# Patient Record
Sex: Female | Born: 1960 | Race: White | Hispanic: No | Marital: Married | Smoking: Never smoker
Health system: Southern US, Community
[De-identification: ages and names within clinical notes are randomized; demographics above are authoritative.]

## PROBLEM LIST (undated history)

## (undated) DIAGNOSIS — F419 Anxiety disorder, unspecified: Secondary | ICD-10-CM

## (undated) DIAGNOSIS — K219 Gastro-esophageal reflux disease without esophagitis: Secondary | ICD-10-CM

## (undated) DIAGNOSIS — D126 Benign neoplasm of colon, unspecified: Secondary | ICD-10-CM

## (undated) DIAGNOSIS — F32A Depression, unspecified: Secondary | ICD-10-CM

## (undated) DIAGNOSIS — I10 Essential (primary) hypertension: Secondary | ICD-10-CM

## (undated) DIAGNOSIS — E785 Hyperlipidemia, unspecified: Secondary | ICD-10-CM

## (undated) HISTORY — DX: Anxiety disorder, unspecified: F41.9

## (undated) HISTORY — DX: Essential (primary) hypertension: I10

## (undated) HISTORY — DX: Hyperlipidemia, unspecified: E78.5

## (undated) HISTORY — PX: COLONOSCOPY: SHX174

## (undated) HISTORY — DX: Gastro-esophageal reflux disease without esophagitis: K21.9

## (undated) HISTORY — DX: Depression, unspecified: F32.A

## (undated) HISTORY — DX: Benign neoplasm of colon, unspecified: D12.6

---

## 2021-05-12 ENCOUNTER — Encounter (HOSPITAL_COMMUNITY): Payer: Self-pay

## 2021-05-12 ENCOUNTER — Emergency Department (HOSPITAL_COMMUNITY)
Admission: EM | Admit: 2021-05-12 | Discharge: 2021-05-12 | Disposition: A | Discharge status: Home / Self Care | Payer: BC Managed Care – PPO | Attending: Emergency Medicine | Admitting: Emergency Medicine

## 2021-05-12 ENCOUNTER — Emergency Department (HOSPITAL_COMMUNITY): Payer: BC Managed Care – PPO

## 2021-05-12 ENCOUNTER — Other Ambulatory Visit: Payer: Self-pay

## 2021-05-12 DIAGNOSIS — R110 Nausea: Secondary | ICD-10-CM | POA: Insufficient documentation

## 2021-05-12 DIAGNOSIS — Z7982 Long term (current) use of aspirin: Secondary | ICD-10-CM | POA: Diagnosis not present

## 2021-05-12 DIAGNOSIS — R002 Palpitations: Secondary | ICD-10-CM | POA: Diagnosis not present

## 2021-05-12 DIAGNOSIS — R079 Chest pain, unspecified: Secondary | ICD-10-CM

## 2021-05-12 DIAGNOSIS — R0789 Other chest pain: Secondary | ICD-10-CM | POA: Diagnosis present

## 2021-05-12 LAB — CBC
HCT: 43.8 % (ref 36.0–46.0)
Hemoglobin: 14.5 g/dL (ref 12.0–15.0)
MCH: 31.2 pg (ref 26.0–34.0)
MCHC: 33.1 g/dL (ref 30.0–36.0)
MCV: 94.2 fL (ref 80.0–100.0)
Platelets: 233 10*3/uL (ref 150–400)
RBC: 4.65 MIL/uL (ref 3.87–5.11)
RDW: 13.5 % (ref 11.5–15.5)
WBC: 10.7 10*3/uL — ABNORMAL HIGH (ref 4.0–10.5)
nRBC: 0 % (ref 0.0–0.2)

## 2021-05-12 LAB — BASIC METABOLIC PANEL
Anion gap: 13 (ref 5–15)
BUN: 14 mg/dL (ref 8–23)
CO2: 24 mmol/L (ref 22–32)
Calcium: 9.2 mg/dL (ref 8.9–10.3)
Chloride: 99 mmol/L (ref 98–111)
Creatinine, Ser: 0.87 mg/dL (ref 0.44–1.00)
GFR, Estimated: >60 mL/min (ref >60)
Glucose, Bld: 103 mg/dL — ABNORMAL HIGH (ref 70–99)
Potassium: 3.3 mmol/L — ABNORMAL LOW (ref 3.5–5.1)
Sodium: 136 mmol/L (ref 135–145)

## 2021-05-12 LAB — TROPONIN I (HIGH SENSITIVITY)
Troponin I (High Sensitivity): <2 ng/L (ref <18)
Troponin I (High Sensitivity): <2 ng/L (ref <18)

## 2021-05-12 NOTE — ED Provider Notes (Signed)
[facility] EMERGENCY DEPARTMENT Provider Note   CSN: [id number] Arrival date & time: 05/12/21  1258     History Chief Complaint  Patient presents with   Chest Pain    Natalie Fox is a 60 y.o. female.   Chest Pain Associated symptoms: nausea and palpitations   Associated symptoms: no abdominal pain, no back pain, no shortness of breath and no weakness   Patient presents with chest pain.  Began around 1130 today.  Lasted around 20 to 25 minutes.  It was severe in the anterior chest.  May have said some slight radiation to the left side.  No nausea or vomiting.  No diaphoresis.  No fevers or chills.  No radiation to arm.  No shortness of breath.  Did not feel palpitations with it.  Patient states she had a brief episode of similar pain on Saturday all that lasted just under a minute.  States on Saturday night she felt some palpitations.  Patient does not have a known cardiac history but states her family members have early cardiac disease with MIs in the 1s.  Has had no exertional pain.  Able to exert herself without any pain.  Has had a stress test around 3 years ago.  Patient is pain-free now.      History reviewed. No pertinent past medical history.  There are no problems to display for this patient.   History reviewed. No pertinent surgical history.   OB History   No obstetric history on file.     No family history on file.  Social History   Tobacco Use   Smoking status: Never   Smokeless tobacco: Never  Substance Use Topics   Alcohol use: Not Currently   Drug use: Never    Home Medications Prior to Admission medications   Medication Sig Start Date End Date Taking? Authorizing Provider  ALPRAZolam Duanne Moron) 0.5 MG tablet Take 0.5 mg by mouth 2 (two) times daily. 04/21/21  Yes [provider]  aspirin 325 MG EC tablet Take 325 mg by mouth daily.   Yes [provider]  Calcium 200 MG TABS Take 1 tablet by mouth daily.   Yes [provider]  Cholecalciferol (VITAMIN D3) 50 MCG (2000 UT) CAPS Take 1 capsule by mouth daily.   Yes [provider]  FLUoxetine (PROZAC) 20 MG capsule Take 20 mg by mouth See admin instructions. Take 40 mg alternate 20mg  every other day. 05/02/21  Yes [provider]  triamterene-hydrochlorothiazide (DYAZIDE) 37.5-25 MG capsule Take 1 capsule by mouth daily. 04/21/21  Yes [provider]  doxycycline (MONODOX) 100 MG capsule SMARTSIG:2 Capsule(s) By Mouth Once Patient not taking: No sig reported 04/23/21   [provider]  predniSONE (DELTASONE) 20 MG tablet Take by mouth. Patient not taking: No sig reported 04/21/21   [provider]    Allergies    Patient has no known allergies.  Review of Systems   Review of Systems  Constitutional:  Negative for appetite change.  HENT:  Negative for congestion.   Respiratory:  Negative for shortness of breath.   Cardiovascular:  Positive for chest pain and palpitations.  Gastrointestinal:  Positive for nausea. Negative for abdominal pain.  Genitourinary:  Negative for flank pain.  Musculoskeletal:  Negative for back pain.  Skin:  Negative for pallor.  Neurological:  Negative for weakness.  Psychiatric/Behavioral:  Negative for confusion.    Physical Exam Updated Vital Signs BP 127/70 (BP Location: Right Arm)   Pulse  66   Temp 98.2 F (36.8 C) (Oral)   Resp 18   Ht 5\' 3"  (1.6 m)   Wt 75.8 kg   SpO2 99%   BMI 29.60 kg/m   Physical Exam Vitals and nursing note reviewed.  HENT:     Head: Atraumatic.  Cardiovascular:     Rate and Rhythm: Normal rate and regular rhythm.  Pulmonary:     Effort: Pulmonary effort is normal.     Breath sounds: No wheezing or rales.  Musculoskeletal:     Right lower leg: No edema.     Left lower leg: No edema.  Skin:    General: Skin is warm.     Capillary Refill: Capillary refill takes less than 2 seconds.  Neurological:     Mental Status: She is alert and  oriented to person, place, and time.    ED Results / Procedures / Treatments   Labs (all labs ordered are listed, but only abnormal results are displayed) Labs Reviewed  BASIC METABOLIC PANEL - Abnormal; Notable for the following components:      Result Value   Potassium 3.3 (*)    Glucose, Bld 103 (*)    All other components within normal limits  CBC - Abnormal; Notable for the following components:   WBC 10.7 (*)    All other components within normal limits  TROPONIN I (HIGH SENSITIVITY)  TROPONIN I (HIGH SENSITIVITY)    EKG EKG Interpretation  Date/Time:  Tuesday May 12 2021 13:26:53 EDT Ventricular Rate:  72 PR Interval:  144 QRS Duration: 84 QT Interval:  402 QTC Calculation: 440 R Axis:   78 Text Interpretation: Normal sinus rhythm Normal ECG No old tracing to compare Confirmed by Daleen Bo [phone]) on 05/12/2021 2:02:13 PM  Radiology DG Chest 2 View  Result Date: 05/12/2021 CLINICAL DATA:  Chest pain. EXAM: CHEST - 2 VIEW COMPARISON:  None. FINDINGS: The cardiac silhouette, mediastinal and hilar contours are within normal limits. The lungs are clear of an acute process. Mild chronic appearing bronchitic type changes. No pleural effusions or pulmonary lesions. The bony thorax is intact. IMPRESSION: No acute cardiopulmonary findings. Electronically Signed   By: Marijo Sanes M.D.   On: 05/12/2021 14:28    Procedures Procedures   Medications Ordered in ED Medications - No data to display  ED Course  I have reviewed the triage vital signs and the nursing notes.  Pertinent labs & imaging results that were available during my care of the patient were reviewed by me and considered in my medical decision making (see chart for details).    MDM Rules/Calculators/A&P                           Patient with anterior chest pain.  Last around 20 minutes.  Pain-free now.  EKG reassuring.  Troponin negative x2.  Chest x-ray reassuring.  Doubt cardiac ischemia as a  cause.  Doubt aortic dissection.  Doubt pulmonary embolism.  Well-appearing.  Lab work and EKG reviewed.  Chest x-ray reviewed.  Follow-up with PCP and also has planned follow-up with cardiology.  Discharge home Final Clinical Impression(s) / ED Diagnoses Final diagnoses:  Nonspecific chest pain    Rx / DC Orders ED Discharge Orders     None        Davonna Belling, MD 05/12/21 1656

## 2021-05-12 NOTE — ED Triage Notes (Signed)
Pt presents to ED with complaints of sharp mid chest pain started at 1130 subsided after about 20 minutes. Pt states radiated to left some, denies diaphoresis or nausea.

## 2021-06-15 ENCOUNTER — Encounter: Payer: Self-pay | Admitting: *Deleted

## 2021-06-16 ENCOUNTER — Other Ambulatory Visit: Payer: Self-pay

## 2021-06-16 ENCOUNTER — Ambulatory Visit: Payer: BC Managed Care – PPO | Admitting: Cardiology

## 2021-06-16 ENCOUNTER — Encounter: Payer: Self-pay | Admitting: Cardiology

## 2021-06-16 VITALS — BP 130/86 | HR 70 | Ht 63.0 in | Wt 168.0 lb

## 2021-06-16 DIAGNOSIS — E782 Mixed hyperlipidemia: Secondary | ICD-10-CM | POA: Diagnosis not present

## 2021-06-16 DIAGNOSIS — I209 Angina pectoris, unspecified: Secondary | ICD-10-CM

## 2021-06-16 DIAGNOSIS — Z8249 Family history of ischemic heart disease and other diseases of the circulatory system: Secondary | ICD-10-CM

## 2021-06-16 DIAGNOSIS — I10 Essential (primary) hypertension: Secondary | ICD-10-CM | POA: Diagnosis not present

## 2021-06-16 MED ORDER — METOPROLOL TARTRATE 100 MG PO TABS: 100.0000 mg | ORAL_TABLET | Freq: Once | ORAL | 0 refills | Status: AC

## 2021-06-16 NOTE — Patient Instructions (Addendum)
Medication Instructions:  Your physician recommends that you continue on your current medications as directed. Please refer to the Current Medication list given to you today.  Labwork: none  Testing/Procedures: Coronary CTA-see instructions below  Follow-Up: Your physician recommends that you schedule a follow-up appointment in: pending  Any Other Special Instructions Will Be Listed Below (If Applicable).  If you need a refill on your cardiac medications before your next appointment, please call your pharmacy.    Your cardiac CT will be scheduled at one of the below locations:   [facility] [address] [place], [place] [address] [phone]  If scheduled at [facility], please arrive at the [facility] main entrance (entrance A) of [facility] 30 minutes prior to test start time. You can use the FREE valet parking offered at the main entrance (encouraged to control the heart rate for the test) Proceed to the [facility] Radiology Department (first floor) to check-in and test prep.  Please follow these instructions carefully (unless otherwise directed):  On the Night Before the Test: Be sure to Drink plenty of water. Do not consume any caffeinated/decaffeinated beverages or chocolate 12 hours prior to your test. Do not take any antihistamines 12 hours prior to your test.  On the Day of the Test: Drink plenty of water until 1 hour prior to the test. Do not eat any food 4 hours prior to the test. You may take your regular medications prior to the test.  Take metoprolol 100 mg (Lopressor) one - two hours prior to test. HOLD triamterene/hctz the morning of the test. FEMALES- please wear underwire-free bra if available, avoid dresses & tight clothing     After the Test: Drink plenty of water. After receiving IV contrast, you may experience a mild flushed feeling. This is normal. On occasion, you may experience a mild rash up to 24 hours after  the test. This is not dangerous. If this occurs, you can take Benadryl 25 mg and increase your fluid intake. If you experience trouble breathing, this can be serious. If it is severe call 911 IMMEDIATELY. If it is mild, please call our office.  Please allow 2-4 weeks for scheduling of routine cardiac CTs. Some insurance companies require a pre-authorization which may delay scheduling of this test.   For non-scheduling related questions, please contact the cardiac imaging nurse navigator should you have any questions/concerns: Marchia Bond, Cardiac Imaging Nurse Navigator Gordy Clement, Cardiac Imaging Nurse Navigator [facility] Direct Office Dial: [phone]   For scheduling needs, including cancellations and rescheduling, please call [place], [phone].

## 2021-06-16 NOTE — Progress Notes (Signed)
Cardiology Office Note  Date: 06/16/2021   ID: Natalie, Fox 12-Sep-1960, MRN [id number]  PCP:  Manon Hilding, MD  Cardiologist:  Rozann Lesches, MD Electrophysiologist:  None   Chief Complaint  Patient presents with   Chest Pain     History of Present Illness: Natalie Fox is a 60 y.o. female referred for for cardiology consultation by Dr. Quintin Alto for the evaluation of chest pain.  She has a history of hypertension on medical therapy, recent LDL 155, also family history of premature CAD in a younger brother and also her maternal grandparents.  She has been concerned about the status of her heart, but has no known history of atherosclerosis.  She did undergo a screening GXT 5 years ago which was normal.  She states that she had an episode of "deep" intense chest discomfort at rest on September 20.  She was seen in the ER at [facility] at which point high-sensitivity troponin I levels were normal and her ECG showed no acute ST segment changes.  Chest x-ray was also unrevealing.  She does not describe similar symptoms to above since that time.  She works in Programmer, applications.  No regular exercise plan at this time, has been riding a bicycle with her granddaughter.  We discussed options for ischemic evaluation.  Past Medical History:  Diagnosis Date   Anxiety disorder    Depression    GERD (gastroesophageal reflux disease)    Hyperlipidemia    Hypertension    Tubular adenoma of colon     Past Surgical History:  Procedure Laterality Date   COLONOSCOPY      Current Outpatient Medications  Medication Sig Dispense Refill   ALPRAZolam (XANAX) 0.5 MG tablet Take 0.5 mg by mouth 2 (two) times daily.     Calcium 200 MG TABS Take 1 tablet by mouth daily.     Cholecalciferol (VITAMIN D3) 50 MCG (2000 UT) CAPS Take 1 capsule by mouth daily.     FLUoxetine (PROZAC) 20 MG capsule Take 20 mg by mouth See admin instructions. Take 40 mg alternate 20mg  every other day.      triamterene-hydrochlorothiazide (DYAZIDE) 37.5-25 MG capsule Take 1 capsule by mouth daily.     No current facility-administered medications for this visit.   Allergies:  Nitrofurantoin macrocrystal   Social History: The patient  reports that she has never smoked. She has never used smokeless tobacco. She reports that she does not currently use alcohol. She reports that she does not use drugs.   Family History: The patient's family history includes Emphysema in her mother; Heart attack in her brother; Heart failure in her father.   ROS: Palpitations or syncope.  Physical Exam: VS:  BP 130/86 (BP Location: Left Arm, Patient Position: Sitting, Cuff Size: Normal)   Pulse 70   Ht 5\' 3"  (1.6 m)   Wt 168 lb (76.2 kg)   SpO2 98%   BMI 29.76 kg/m , BMI Body mass index is 29.76 kg/m.  Wt Readings from Last 3 Encounters:  06/16/21 168 lb (76.2 kg)  05/12/21 167 lb 1.6 oz (75.8 kg)    General: Patient appears comfortable at rest. HEENT: Conjunctiva and lids normal, wearing a mask. Neck: Supple, no elevated JVP or carotid bruits, no thyromegaly. Lungs: Clear to auscultation, nonlabored breathing at rest. Cardiac: Regular rate and rhythm, no S3 or significant systolic murmur, no pericardial rub. Abdomen: Soft, nontender, bowel sounds present. Extremities: No pitting edema, distal pulses 2+. Skin: Warm  and dry. Musculoskeletal: No kyphosis. Neuropsychiatric: Alert and oriented x3, affect grossly appropriate.  ECG:  An ECG dated 06/27/2018 was personally reviewed today and demonstrated:  Sinus bradycardia.  Recent Labwork: 05/12/2021: BUN 14; Creatinine, Ser 0.87; Hemoglobin 14.5; Platelets 233; Potassium 3.3; Sodium 136  August 2022: Hemoglobin 13.1, platelets 269, BUN 20, creatinine 0.84, potassium 4.0, AST 14, ALT 14, cholesterol 213, triglycerides 103, HDL 39, LDL 155, TSH 1.89  Other Studies Reviewed Today:  CXR 05/12/2021: FINDINGS: The cardiac silhouette, mediastinal and hilar  contours are within normal limits. The lungs are clear of an acute process. Mild chronic appearing bronchitic type changes. No pleural effusions or pulmonary lesions. The bony thorax is intact.   IMPRESSION: No acute cardiopulmonary findings.  Assessment and Plan:  1.  Episode of chest pain as described above in a 60 year old woman with hypertension, LDL of 155 not on statin therapy, and family history of premature CAD.  Concern is for angina pectoris, she does not describe prior symptomatology.  We have discussed options for further evaluation and we will plan to proceed with a coronary CTA for further evaluation.  2.  LDL 155.  10-year estimated CVD risk is 6.5%.  Would be reasonable to consider moderate intensity statin therapy in her case particularly in light of family history of premature CAD.  We will get further information as well with above testing.  3.  Essential hypertension, currently on Dyazide.  Medication Adjustments/Labs and Tests Ordered: Current medicines are reviewed at length with the patient today.  Concerns regarding medicines are outlined above.   Tests Ordered: Orders Placed This Encounter  Procedures   CT CORONARY MORPH W/CTA COR W/SCORE W/CA W/CM &/OR WO/CM     Medication Changes: No orders of the defined types were placed in this encounter.   Disposition:  Follow up  test results.  Signed, Satira Sark, MD, [facility] 06/16/2021 1:49 PM    [place] [facility] [place], [place],  [address] Phone: [phone]; Fax: [phone]

## 2021-06-17 ENCOUNTER — Other Ambulatory Visit (HOSPITAL_COMMUNITY): Payer: Self-pay | Admitting: Emergency Medicine

## 2021-06-17 DIAGNOSIS — Z8249 Family history of ischemic heart disease and other diseases of the circulatory system: Secondary | ICD-10-CM

## 2021-06-18 ENCOUNTER — Other Ambulatory Visit: Payer: Self-pay | Admitting: Cardiology

## 2021-06-18 ENCOUNTER — Other Ambulatory Visit: Payer: Self-pay | Admitting: *Deleted

## 2021-06-18 DIAGNOSIS — Z79899 Other long term (current) drug therapy: Secondary | ICD-10-CM

## 2021-06-19 LAB — BASIC METABOLIC PANEL
BUN/Creatinine Ratio: 13 (ref 12–28)
BUN: 11 mg/dL (ref 8–27)
CO2: 29 mmol/L (ref 20–29)
Calcium: 9.8 mg/dL (ref 8.7–10.3)
Chloride: 99 mmol/L (ref 96–106)
Creatinine, Ser: 0.87 mg/dL (ref 0.57–1.00)
Glucose: 88 mg/dL (ref 70–99)
Potassium: 4.3 mmol/L (ref 3.5–5.2)
Sodium: 141 mmol/L (ref 134–144)
eGFR: 76 mL/min/{1.73_m2} (ref >59)

## 2021-06-22 ENCOUNTER — Telehealth: Payer: Self-pay | Admitting: *Deleted

## 2021-06-22 NOTE — Telephone Encounter (Signed)
-----   Message from Satira Sark, MD sent at 06/19/2021  4:40 PM EDT ----- Results reviewed.  Potassium and renal function are normal pending coronary CTA.

## 2021-06-22 NOTE — Telephone Encounter (Signed)
Patient informed. Copy sent to PCP °

## 2021-06-23 ENCOUNTER — Telehealth (HOSPITAL_COMMUNITY): Payer: Self-pay | Admitting: *Deleted

## 2021-06-23 NOTE — Telephone Encounter (Signed)
Reaching out to patient to offer assistance regarding upcoming cardiac imaging study; pt verbalizes understanding of appt date/time, parking situation and where to check in, pre-test NPO status and medications ordered, and verified current allergies; name and call back number provided for further questions should they arise ° °Joyia Riehle RN Navigator Cardiac Imaging °[facility] °[phone] office °[phone] cell  ° °Patient to take 100mg metoprolol tartrate two hours prior to cardiac CT scan. °

## 2021-06-24 ENCOUNTER — Ambulatory Visit (HOSPITAL_COMMUNITY)
Admission: RE | Admit: 2021-06-24 | Discharge: 2021-06-24 | Disposition: A | Discharge status: Home / Self Care | Payer: BC Managed Care – PPO | Source: Ambulatory Visit | Attending: Cardiology | Admitting: Cardiology

## 2021-06-24 ENCOUNTER — Encounter (HOSPITAL_COMMUNITY): Payer: Self-pay

## 2021-06-24 ENCOUNTER — Other Ambulatory Visit: Payer: Self-pay

## 2021-06-24 ENCOUNTER — Other Ambulatory Visit: Payer: Self-pay | Admitting: Internal Medicine

## 2021-06-24 ENCOUNTER — Ambulatory Visit (HOSPITAL_COMMUNITY)
Admission: RE | Admit: 2021-06-24 | Discharge: 2021-06-24 | Disposition: A | Discharge status: Home / Self Care | Payer: BC Managed Care – PPO | Source: Ambulatory Visit | Attending: Internal Medicine | Admitting: Internal Medicine

## 2021-06-24 DIAGNOSIS — I2510 Atherosclerotic heart disease of native coronary artery without angina pectoris: Secondary | ICD-10-CM | POA: Diagnosis not present

## 2021-06-24 DIAGNOSIS — R931 Abnormal findings on diagnostic imaging of heart and coronary circulation: Secondary | ICD-10-CM

## 2021-06-24 DIAGNOSIS — I209 Angina pectoris, unspecified: Secondary | ICD-10-CM | POA: Insufficient documentation

## 2021-06-24 MED ORDER — IOHEXOL 350 MG/ML SOLN
95.0000 mL | Freq: Once | INTRAVENOUS | Status: AC | PRN
  Administered 2021-06-24: 95 mL via INTRAVENOUS

## 2021-06-24 MED ORDER — NITROGLYCERIN 0.4 MG SL SUBL
SUBLINGUAL_TABLET | SUBLINGUAL | Status: AC
  Filled 2021-06-24: qty 2

## 2021-06-24 MED ORDER — NITROGLYCERIN 0.4 MG SL SUBL
0.8000 mg | SUBLINGUAL_TABLET | Freq: Once | SUBLINGUAL | Status: AC
  Administered 2021-06-24: 0.8 mg via SUBLINGUAL

## 2021-06-24 NOTE — Progress Notes (Signed)
Please send cardiac CT for FFR - Dr. Debara Pickett

## 2021-06-25 ENCOUNTER — Telehealth: Payer: Self-pay | Admitting: *Deleted

## 2021-06-25 ENCOUNTER — Other Ambulatory Visit: Payer: Self-pay | Admitting: *Deleted

## 2021-06-25 DIAGNOSIS — Z79899 Other long term (current) drug therapy: Secondary | ICD-10-CM

## 2021-06-25 DIAGNOSIS — E782 Mixed hyperlipidemia: Secondary | ICD-10-CM

## 2021-06-25 MED ORDER — ROSUVASTATIN CALCIUM 20 MG PO TABS: 20.0000 mg | ORAL_TABLET | Freq: Every day | ORAL | 1 refills | Status: DC

## 2021-06-25 NOTE — Telephone Encounter (Signed)
Patient informed and verbalized understanding of plan. Copy sent to PCP Lab orders faxed to [facility]

## 2021-06-25 NOTE — Telephone Encounter (Signed)
-----   Message from Natalie Sark, MD sent at 06/24/2021  8:24 PM EDT ----- Results reviewed. Mild to moderate coronary atherosclerosis noted with calcium score of 351. Fortunately the FFR analysis does not indicate obstructive CAD or necessarily a cause of chest pain. This would further suggest that moderate intensity statin therapy should be considered for cardiac risk reduction. If she agrees would start Crestor 20 mg daily. Can then check FLP and LFTs in 6 months with office visit.

## 2021-07-23 ENCOUNTER — Encounter: Payer: Self-pay | Admitting: Cardiology

## 2021-07-23 NOTE — Telephone Encounter (Signed)
Thank you for letting me know.  I would suggest that you stop Crestor since the reaction you had could have been angioedema, an allergic reaction.  We could consider trying a different statin that is metabolized differently such as Lipitor, in that case would start 20 mg daily to see how you do before trying to increase the dose if needed.

## 2021-11-13 ENCOUNTER — Encounter: Payer: Self-pay | Admitting: *Deleted

## 2022-03-27 ENCOUNTER — Other Ambulatory Visit: Payer: Self-pay | Admitting: Cardiology

## 2022-04-21 ENCOUNTER — Other Ambulatory Visit: Payer: Self-pay | Admitting: Cardiology

## 2022-05-09 ENCOUNTER — Other Ambulatory Visit: Payer: Self-pay | Admitting: Cardiology

## 2022-09-19 IMAGING — DX DG CHEST 2V
2 series · 2 of 2 positions shown · non-contrast
Comparison: None.

CLINICAL DATA: Chest pain.

EXAM:
CHEST - 2 VIEW

[chest pa]
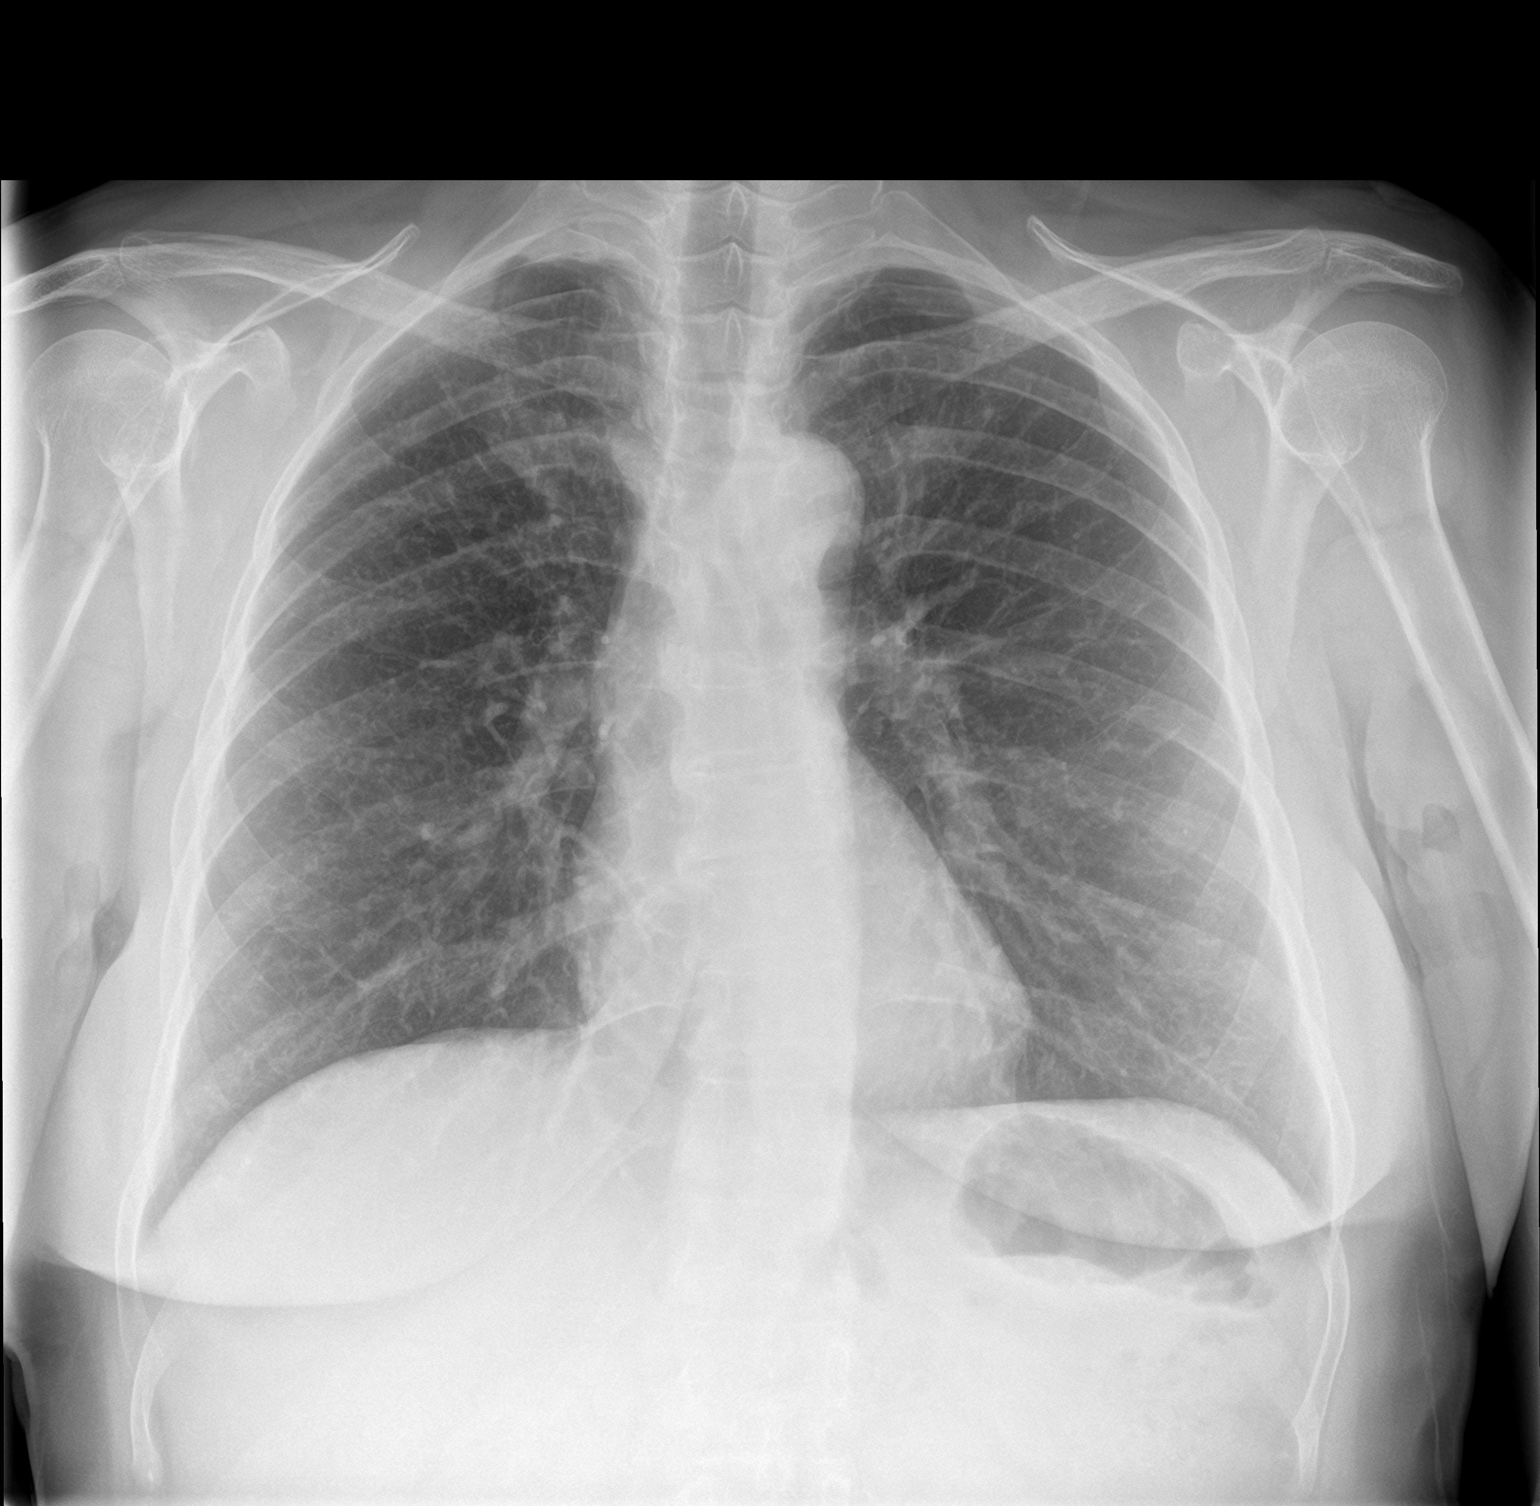

[chest lat]
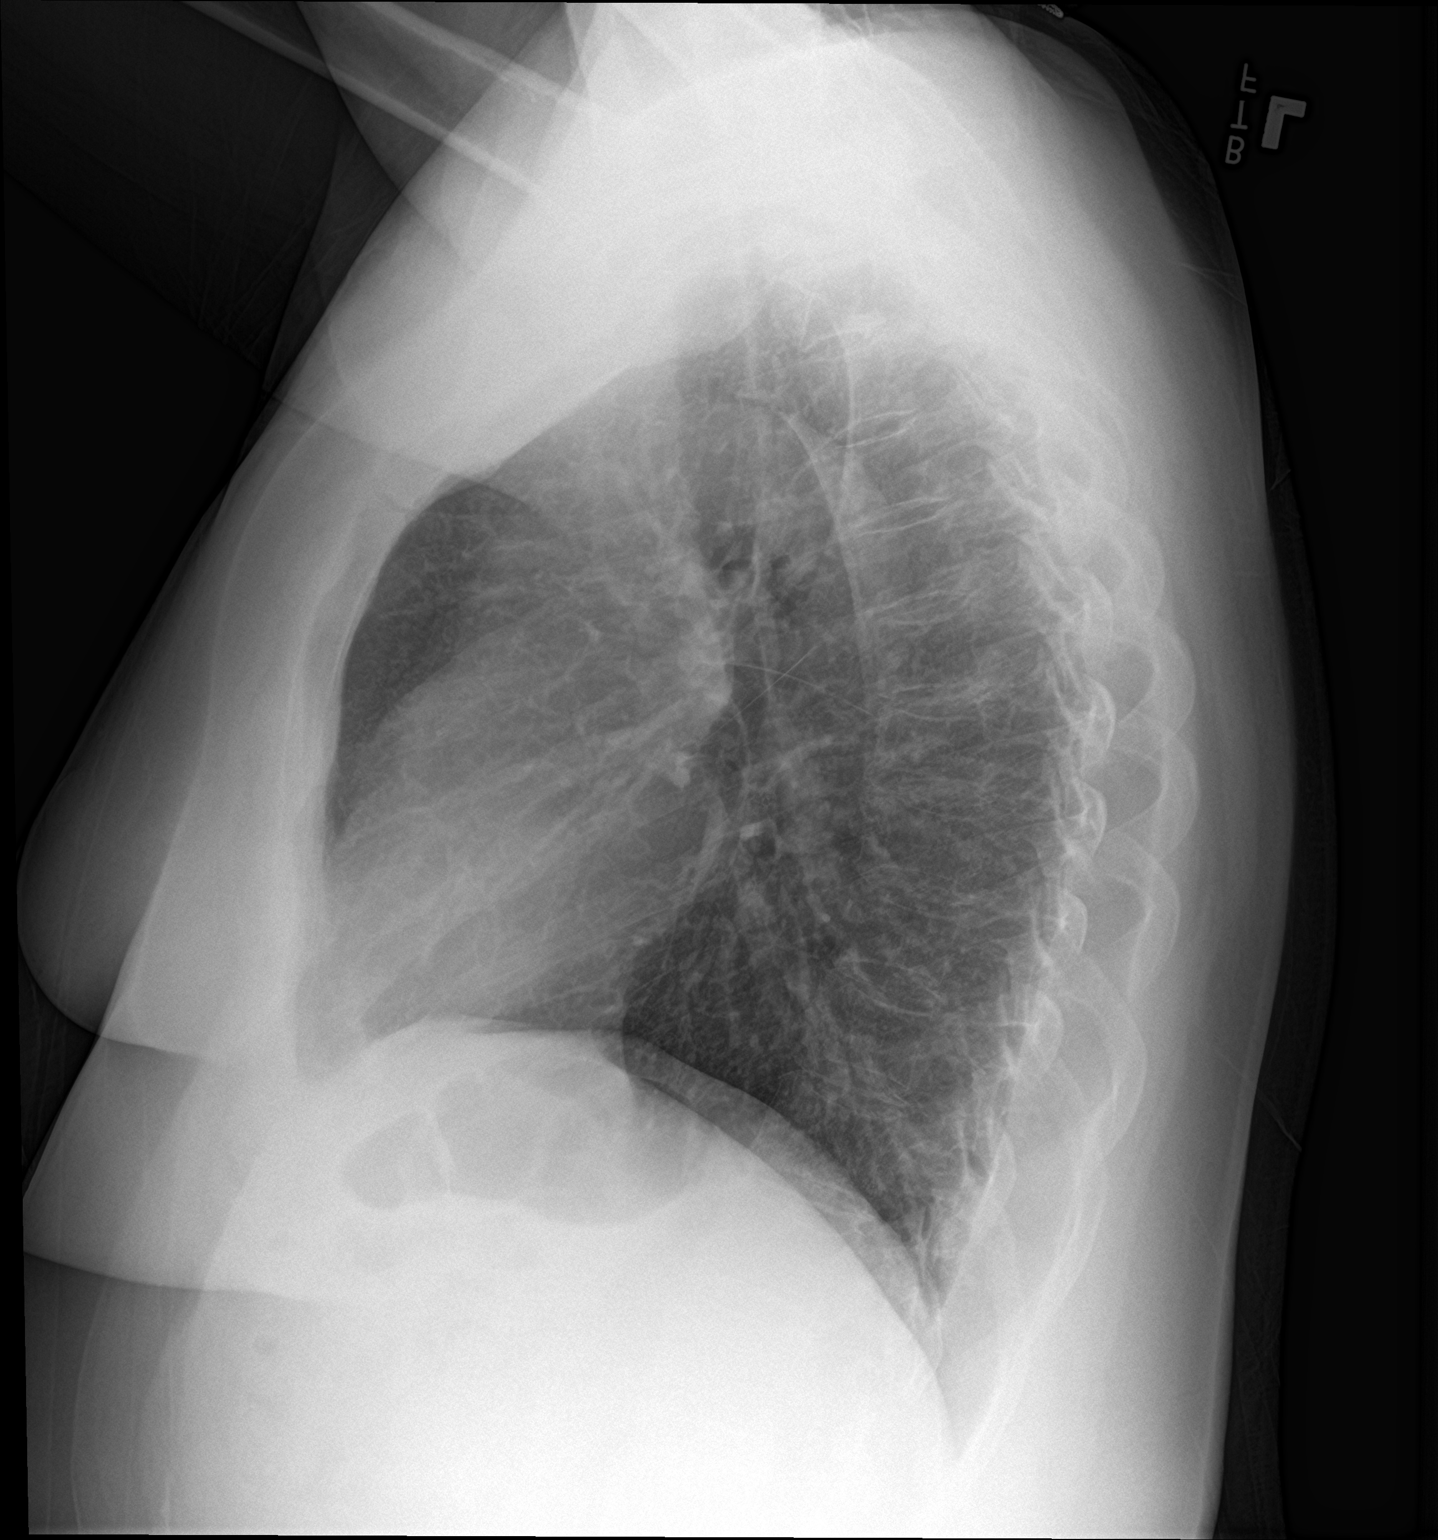

[2 of 2 positions shown; findings below may reference images not displayed]

FINDINGS: The cardiac silhouette, mediastinal and hilar contours are within
normal limits. The lungs are clear of an acute process. Mild chronic
appearing bronchitic type changes. No pleural effusions or pulmonary
lesions. The bony thorax is intact.
IMPRESSION: No acute cardiopulmonary findings.

## 2022-10-14 ENCOUNTER — Telehealth: Payer: Self-pay | Admitting: Cardiology

## 2022-10-14 NOTE — Telephone Encounter (Signed)
FYI.  °Contacted patient regarding recall appointment, patient notified our office they did not wish to keep this appointment at this time.  Deleted recall from system. °

## 2022-11-01 IMAGING — CT CT HEART MORP W/ CTA COR W/ SCORE W/ CA W/CM &/OR W/O CM
1 series · 5 of 7 positions shown, 7 images · IV contrast (omnipaque)
Comparison: None.
COMPARISON: None.

Addendum:
EXAM:
OVER-READ INTERPRETATION  CT CHEST

The following report is an over-read performed by radiologist Dr.
Leonel Alejandro Angaspilco [REDACTED] on 06/24/2021. This
over-read does not include interpretation of cardiac or coronary
anatomy or pathology. The coronary CTA interpretation by the
cardiologist is attached.
HISTORY: 60 yo female with chest pain/anginal equiv
Cardiac/Coronary CTA
TECHNIQUE: The patient was scanned on a Siemens Force scanner.
PROTOCOL: A 100 kV prospective scan was triggered in the descending thoracic
aorta at 111 HU's. Axial non-contrast 3 mm slices were carried out
through the heart. The data set was analyzed on a dedicated work
station and scored using the Agatson method. Gantry rotation speed
was 250 msecs and collimation was .6 mm. Beta blockade and 0.8 mg of
sl NTG was given. The 3D data set was reconstructed in 5% intervals
of the 35-75 % of the R-R cycle. Diastolic phases were analyzed on a
dedicated work station using MPR, MIP and VRT modes. The patient
received 95mL OMNIPAQUE IOHEXOL 350 MG/ML SOLN of contrast.

[Series 3332: coronaries · 5 of 7 slices shown, 7 images]
[im 2/7  vessel]
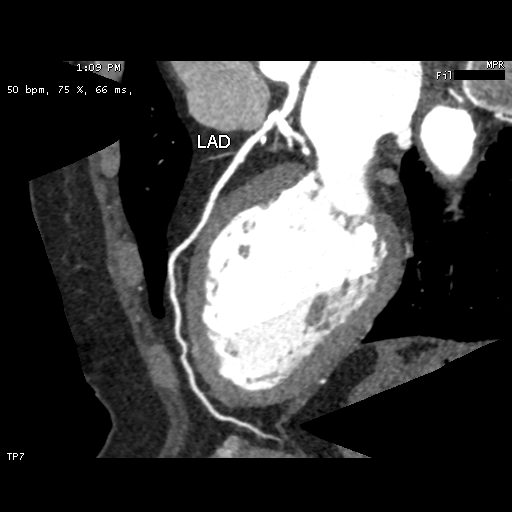
[im 2/7  lung]
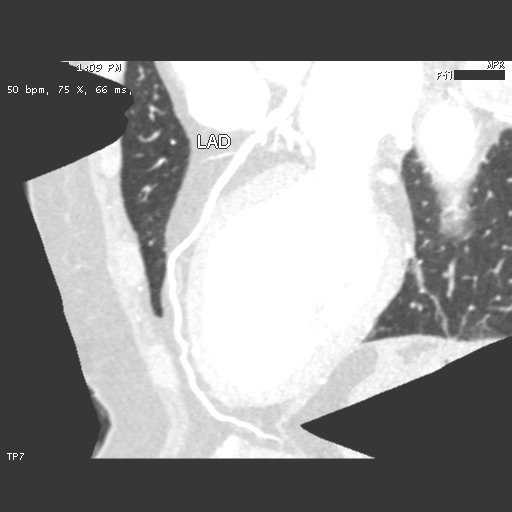
[im 3/7  vessel]
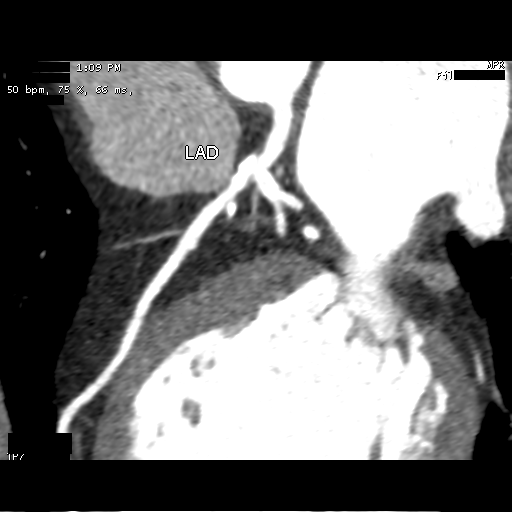
[im 4/7  vessel]
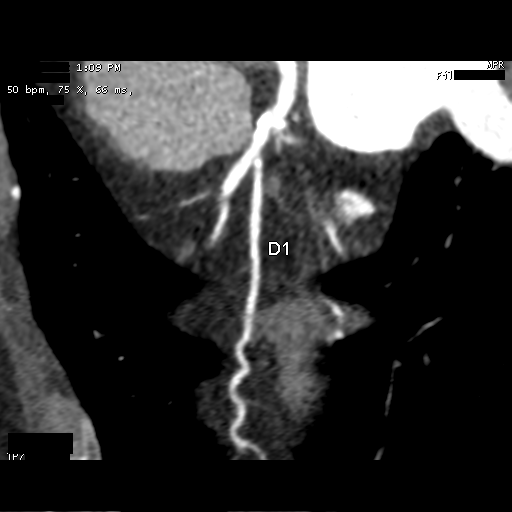
[im 5/7  vessel]
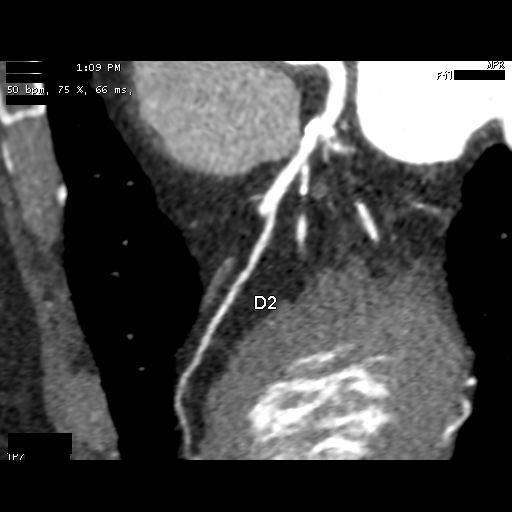
[im 6/7  vessel]
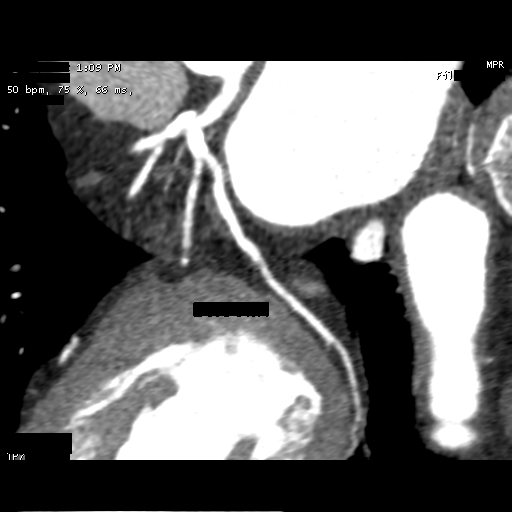
[im 6/7  lung]
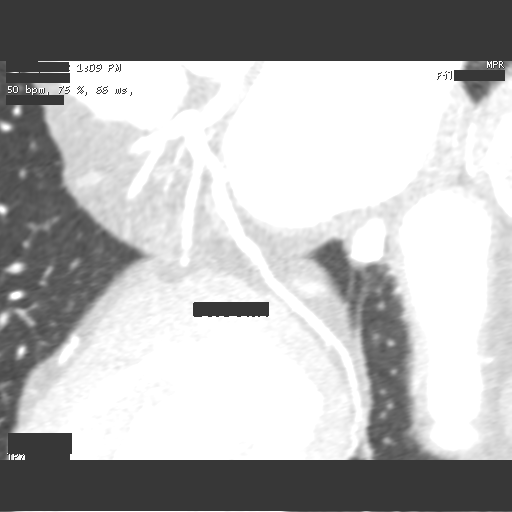

[5 of 7 positions shown; findings below may reference images not displayed]

FINDINGS: Vascular: No significant noncardiac vascular findings.

Mediastinum/Nodes: The visualized mediastinum and hilar regions
demonstrate no lymphadenopathy or masses.

Lungs/Pleura: Visualized lungs show no evidence of pulmonary edema,
consolidation, pneumothorax, nodule or pleural fluid.

Upper Abdomen: No acute abnormality.

Musculoskeletal: No chest wall mass or suspicious bone lesions
identified.
IMPRESSION: No significant incidental findings.
FINDINGS: Quality: Good, HR

Coronary calcium score: The patient's coronary artery calcium score
is 351, which places the patient in the 96th percentile.

Coronary arteries: Normal coronary origins.  Right dominance.

Right Coronary Artery: Dominant. Mild to moderate mixed proximal
stenosis 50-69% (T1AS1AGB). No significant distal vessel disease.

Left Main Coronary Artery: Normal. Bifurcates into the LAD and LCx
arteries.

Left Anterior Descending Coronary Artery: Large anterior vessel that
wraps around the apex. There is minimal to mild mixed 25-49% ostial
stenosis (RKHJKHSZ) at the LAD/LCX bifurcation. 2 moderate sized
diagonal branches with minimal 1-24% mixed (1KAIKA5L) stenosis
proximally of D1 but no disease in D2.

Left Circumflex Artery: AV groove vessel with mild 25-49% ostial
mixed stenosis (RKHJKHSZ). There is minimal proximal to mid-vessel
stenoses (1KAIKA5L). Moderate sized high OM1 and OM2 branches, both
without disease.

Aorta: Normal size, 30 mm at the mid ascending aorta (level of the
PA bifurcation) measured double oblique. Aortic atherosclerosis. No
dissection.

Aortic Valve: Trileaflet. No calcifications.

Other findings:

Normal pulmonary vein drainage into the left atrium.

Normal left atrial appendage without a thrombus.

Normal size of the pulmonary artery.
IMPRESSION: 1. Mild to moderate CAD, more significant in the RCA, CADRADS = 3.
CT FFR will be performed and reported separately.

2. Coronary calcium score of 351. This was 96th percentile for age
and sex matched control.

3. Normal coronary origin with right dominance.

4. Aortic atherosclerosis.

*** End of Addendum ***
EXAM:
OVER-READ INTERPRETATION  CT CHEST

The following report is an over-read performed by radiologist Dr.
Leonel Alejandro Angaspilco [REDACTED] on 06/24/2021. This
over-read does not include interpretation of cardiac or coronary
anatomy or pathology. The coronary CTA interpretation by the
cardiologist is attached.
FINDINGS: Vascular: No significant noncardiac vascular findings.

Mediastinum/Nodes: The visualized mediastinum and hilar regions
demonstrate no lymphadenopathy or masses.

Lungs/Pleura: Visualized lungs show no evidence of pulmonary edema,
consolidation, pneumothorax, nodule or pleural fluid.

Upper Abdomen: No acute abnormality.

Musculoskeletal: No chest wall mass or suspicious bone lesions
identified.
IMPRESSION: No significant incidental findings.
